# Patient Record
Sex: Male | Born: 1955 | Race: Black or African American | Hispanic: No | Marital: Married | Smoking: Never smoker
Health system: Southern US, Community
[De-identification: ages and names within clinical notes are randomized; demographics above are authoritative.]

## PROBLEM LIST (undated history)

## (undated) DIAGNOSIS — E7800 Pure hypercholesterolemia, unspecified: Secondary | ICD-10-CM

## (undated) DIAGNOSIS — E119 Type 2 diabetes mellitus without complications: Secondary | ICD-10-CM

## (undated) DIAGNOSIS — I10 Essential (primary) hypertension: Secondary | ICD-10-CM

---

## 2009-08-11 ENCOUNTER — Emergency Department: Payer: Self-pay | Admitting: Emergency Medicine

## 2009-12-21 ENCOUNTER — Ambulatory Visit: Payer: Self-pay | Admitting: Family Medicine

## 2009-12-23 ENCOUNTER — Ambulatory Visit: Payer: Self-pay | Admitting: Internal Medicine

## 2009-12-31 ENCOUNTER — Ambulatory Visit: Payer: Self-pay | Admitting: Internal Medicine

## 2016-06-16 ENCOUNTER — Emergency Department
Admission: EM | Admit: 2016-06-16 | Discharge: 2016-06-16 | Disposition: A | Discharge status: Home / Self Care | Payer: BC Managed Care – PPO | Attending: Emergency Medicine | Admitting: Emergency Medicine

## 2016-06-16 ENCOUNTER — Encounter: Payer: Self-pay | Admitting: Emergency Medicine

## 2016-06-16 ENCOUNTER — Emergency Department: Payer: BC Managed Care – PPO

## 2016-06-16 DIAGNOSIS — M5137 Other intervertebral disc degeneration, lumbosacral region: Secondary | ICD-10-CM | POA: Diagnosis not present

## 2016-06-16 DIAGNOSIS — Y998 Other external cause status: Secondary | ICD-10-CM | POA: Diagnosis not present

## 2016-06-16 DIAGNOSIS — Y92410 Unspecified street and highway as the place of occurrence of the external cause: Secondary | ICD-10-CM | POA: Insufficient documentation

## 2016-06-16 DIAGNOSIS — S3992XA Unspecified injury of lower back, initial encounter: Secondary | ICD-10-CM | POA: Diagnosis present

## 2016-06-16 DIAGNOSIS — Z7984 Long term (current) use of oral hypoglycemic drugs: Secondary | ICD-10-CM | POA: Insufficient documentation

## 2016-06-16 DIAGNOSIS — E119 Type 2 diabetes mellitus without complications: Secondary | ICD-10-CM | POA: Insufficient documentation

## 2016-06-16 DIAGNOSIS — I10 Essential (primary) hypertension: Secondary | ICD-10-CM | POA: Diagnosis not present

## 2016-06-16 DIAGNOSIS — S39012A Strain of muscle, fascia and tendon of lower back, initial encounter: Secondary | ICD-10-CM | POA: Insufficient documentation

## 2016-06-16 DIAGNOSIS — Y9389 Activity, other specified: Secondary | ICD-10-CM | POA: Insufficient documentation

## 2016-06-16 DIAGNOSIS — Z79899 Other long term (current) drug therapy: Secondary | ICD-10-CM | POA: Insufficient documentation

## 2016-06-16 HISTORY — DX: Essential (primary) hypertension: I10

## 2016-06-16 HISTORY — DX: Pure hypercholesterolemia, unspecified: E78.00

## 2016-06-16 HISTORY — DX: Type 2 diabetes mellitus without complications: E11.9

## 2016-06-16 LAB — GLUCOSE, CAPILLARY: Glucose-Capillary: 138 mg/dL — ABNORMAL HIGH (ref 65–99)

## 2016-06-16 MED ORDER — METAXALONE 800 MG PO TABS: 800.0000 mg | ORAL_TABLET | Freq: Three times a day (TID) | ORAL | 0 refills | Status: AC

## 2016-06-16 MED ORDER — DICLOFENAC SODIUM 75 MG PO TBEC: 75.0000 mg | DELAYED_RELEASE_TABLET | Freq: Two times a day (BID) | ORAL | 0 refills | Status: AC

## 2016-06-16 MED ORDER — CYCLOBENZAPRINE HCL 10 MG PO TABS
5.0000 mg | ORAL_TABLET | Freq: Once | ORAL | Status: AC
  Administered 2016-06-16: 5 mg via ORAL
  Filled 2016-06-16: qty 1

## 2016-06-16 MED ORDER — NAPROXEN 500 MG PO TABS
500.0000 mg | ORAL_TABLET | Freq: Once | ORAL | Status: AC
  Administered 2016-06-16: 500 mg via ORAL
  Filled 2016-06-16: qty 1
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
